# Patient Record
Sex: Female | Born: 1990 | Race: White | Hispanic: No | Marital: Single | Smoking: Current every day smoker
Health system: Southern US, Community
[De-identification: ages and names within clinical notes are randomized; demographics above are authoritative.]

## PROBLEM LIST (undated history)

## (undated) DIAGNOSIS — R002 Palpitations: Secondary | ICD-10-CM

## (undated) DIAGNOSIS — F913 Oppositional defiant disorder: Secondary | ICD-10-CM

## (undated) DIAGNOSIS — F1910 Other psychoactive substance abuse, uncomplicated: Secondary | ICD-10-CM

## (undated) DIAGNOSIS — R000 Tachycardia, unspecified: Secondary | ICD-10-CM

## (undated) DIAGNOSIS — F341 Dysthymic disorder: Secondary | ICD-10-CM

## (undated) DIAGNOSIS — F329 Major depressive disorder, single episode, unspecified: Secondary | ICD-10-CM

## (undated) DIAGNOSIS — F32A Depression, unspecified: Secondary | ICD-10-CM

## (undated) DIAGNOSIS — R42 Dizziness and giddiness: Secondary | ICD-10-CM

## (undated) DIAGNOSIS — N926 Irregular menstruation, unspecified: Secondary | ICD-10-CM

## (undated) DIAGNOSIS — R4689 Other symptoms and signs involving appearance and behavior: Secondary | ICD-10-CM

## (undated) DIAGNOSIS — Z658 Other specified problems related to psychosocial circumstances: Secondary | ICD-10-CM

## (undated) DIAGNOSIS — D649 Anemia, unspecified: Secondary | ICD-10-CM

## (undated) HISTORY — DX: Dizziness and giddiness: R42

## (undated) HISTORY — DX: Irregular menstruation, unspecified: N92.6

## (undated) HISTORY — DX: Palpitations: R00.2

## (undated) HISTORY — DX: Major depressive disorder, single episode, unspecified: F32.9

## (undated) HISTORY — DX: Other symptoms and signs involving appearance and behavior: R46.89

## (undated) HISTORY — DX: Other specified problems related to psychosocial circumstances: Z65.8

## (undated) HISTORY — DX: Other psychoactive substance abuse, uncomplicated: F19.10

## (undated) HISTORY — DX: Anemia, unspecified: D64.9

## (undated) HISTORY — DX: Depression, unspecified: F32.A

## (undated) HISTORY — DX: Tachycardia, unspecified: R00.0

## (undated) HISTORY — DX: Oppositional defiant disorder: F91.3

## (undated) HISTORY — DX: Dysthymic disorder: F34.1

---
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
## 2012-07-22 ENCOUNTER — Emergency Department (HOSPITAL_COMMUNITY)
Admission: EM | Admit: 2012-07-22 | Discharge: 2012-07-22 | Disposition: A | Discharge status: Home / Self Care | Payer: Managed Care, Other (non HMO) | Attending: Emergency Medicine | Admitting: Emergency Medicine

## 2012-07-22 ENCOUNTER — Encounter (HOSPITAL_COMMUNITY): Payer: Self-pay | Admitting: Family Medicine

## 2012-07-22 DIAGNOSIS — F17200 Nicotine dependence, unspecified, uncomplicated: Secondary | ICD-10-CM | POA: Insufficient documentation

## 2012-07-22 DIAGNOSIS — Z8659 Personal history of other mental and behavioral disorders: Secondary | ICD-10-CM | POA: Insufficient documentation

## 2012-07-22 DIAGNOSIS — Z862 Personal history of diseases of the blood and blood-forming organs and certain disorders involving the immune mechanism: Secondary | ICD-10-CM | POA: Insufficient documentation

## 2012-07-22 DIAGNOSIS — R109 Unspecified abdominal pain: Secondary | ICD-10-CM | POA: Insufficient documentation

## 2012-07-22 DIAGNOSIS — Z3202 Encounter for pregnancy test, result negative: Secondary | ICD-10-CM | POA: Insufficient documentation

## 2012-07-22 DIAGNOSIS — Z8742 Personal history of other diseases of the female genital tract: Secondary | ICD-10-CM | POA: Insufficient documentation

## 2012-07-22 DIAGNOSIS — Z638 Other specified problems related to primary support group: Secondary | ICD-10-CM | POA: Insufficient documentation

## 2012-07-22 DIAGNOSIS — N898 Other specified noninflammatory disorders of vagina: Secondary | ICD-10-CM | POA: Insufficient documentation

## 2012-07-22 DIAGNOSIS — N939 Abnormal uterine and vaginal bleeding, unspecified: Secondary | ICD-10-CM

## 2012-07-22 DIAGNOSIS — Z658 Other specified problems related to psychosocial circumstances: Secondary | ICD-10-CM | POA: Insufficient documentation

## 2012-07-22 DIAGNOSIS — F1910 Other psychoactive substance abuse, uncomplicated: Secondary | ICD-10-CM | POA: Insufficient documentation

## 2012-07-22 DIAGNOSIS — Z8679 Personal history of other diseases of the circulatory system: Secondary | ICD-10-CM | POA: Insufficient documentation

## 2012-07-22 LAB — WET PREP, GENITAL
WBC, Wet Prep HPF POC: NONE SEEN
Yeast Wet Prep HPF POC: NONE SEEN

## 2012-07-22 LAB — PREGNANCY, URINE: Preg Test, Ur: NEGATIVE

## 2012-07-22 NOTE — ED Provider Notes (Signed)
History     CSN: [id number]  Arrival date & time 07/22/12  0339   First MD Initiated Contact with Patient 07/22/12 0411      Chief Complaint  Patient presents with  . Vaginal Bleeding    (Consider location/radiation/quality/duration/timing/severity/associated sxs/prior treatment) HPI 22 year old female, G2 P0 who puts her last menstrual period at December 28 presents emergency department complaining of vaginal bleeding and concern for miscarriage. Patient reports she was told on Monday that she was pregnant after blood tests done at her doctor's office.  Tonight while having intercourse, her boyfriend told her that she was having bleeding. She reports sharp cramping sensation in her lower abdomen. She denies passage of any tissue or clots. She does not feel she is bleeding any further. Patient reports she had a miscarriage last year. Patient reports she was planning to go to [facility] tomorrow for termination of the pregnancy. She does not know her blood type. She denies any fever chills, no urinary symptoms, no vaginal discharge prior to the bleeding. Past Medical History  Diagnosis Date  . Dysthymic disorder   . Oppositional defiant behavior   . Polysubstance abuse   . Interpersonal problem   . Anemia   . Depression   . Palpitations   . Tachycardia     exaggerated tacycardia response upon standing  . Dizziness   . Irregular menstrual cycle     Past Surgical History  Procedure Date  . Tonsillectomy 2008  . Multiple tooth extractions 2009    Family History  Problem Relation Age of Onset  . Cancer Paternal Grandmother     History  Substance Use Topics  . Smoking status: Current Every Day Smoker -- 1.0 packs/day    Types: Cigarettes  . Smokeless tobacco: Not on file  . Alcohol Use: Yes     Comment: Occasionally    OB History    Grav Para Term Preterm Abortions TAB SAB Ect Mult Living                  Review of Systems  All other systems reviewed and  are negative.    Allergies  Review of patient's allergies indicates no known allergies.  Home Medications  No current outpatient prescriptions on file.  BP 112/68  Pulse 96  Temp 98.5 F (36.9 C) (Oral)  Resp 19  Ht 5\' 6"  (1.676 m)  Wt 122 lb 6.4 oz (55.52 kg)  BMI 19.76 kg/m2  SpO2 100%  LMP 06/13/2012  Physical Exam  Nursing note and vitals reviewed. Constitutional: She is oriented to person, place, and time. She appears well-developed and well-nourished. She appears distressed (patient is tearful, curled up on the bed).  HENT:  Head: Normocephalic and atraumatic.  Nose: Nose normal.  Mouth/Throat: Oropharynx is clear and moist. No oropharyngeal exudate.  Neck: Normal range of motion. No JVD present. No tracheal deviation present. No thyromegaly present.  Cardiovascular: Normal rate, regular rhythm, normal heart sounds and intact distal pulses.  Exam reveals no gallop and no friction rub.   No murmur heard. Pulmonary/Chest: Effort normal and breath sounds normal. No stridor. No respiratory distress. She has no wheezes. She has no rales. She exhibits no tenderness.  Abdominal: Soft. Bowel sounds are normal. She exhibits no distension and no mass. There is no guarding.  Genitourinary:       External genitalia normal Vagina with dark blood, no clots or tissue Cervix closed no lesions No cervical motion tenderness Adnexa palpated, no masses or tenderness noted  Bladder palpated no tenderness Uterus palpated no masses  But mild tenderness    Musculoskeletal: Normal range of motion. She exhibits no edema and no tenderness.  Lymphadenopathy:    She has no cervical adenopathy.  Neurological: She is alert and oriented to person, place, and time. She exhibits normal muscle tone. Coordination normal.  Skin: Skin is warm and dry. No rash noted. She is not diaphoretic. No erythema. No pallor.  Psychiatric:       Patient has flat affect, tearful    ED Course  Procedures  (including critical care time)   Labs Reviewed  PREGNANCY, URINE  WET PREP, GENITAL  GC/CHLAMYDIA PROBE AMP   No results found.   1. Vaginal bleeding       MDM  22 year old female with vaginal bleeding, reported positive pregnancy test. Patient was planning on termination of pregnancy tomorrow. We'll check pelvic exam for possible cervical pathology. Also get GC chlamydia and wet prep done  6:41 AM Patient with negative pregnancy test at this time. It is unclear if she is having a normal menstrual period, or if she has had a completed miscarriage. As she only started bleeding a little while ago, I would expect her to still have a positive pregnancy test. I will refer her back to her primary care doctor who diagnosed her with the pregnancy, and[facility]or [facility].        Olivia Mackie, MD 07/22/12 [phone]

## 2012-07-22 NOTE — ED Notes (Addendum)
Patient states "I'm fairly certain I just had a miscarriage." Patient states she started having cramping in her lower abdomen and vaginal bleeding at 0300. Patient states she was having intercourse when the symptoms started. Patient unsure if she has passed clots or any products of conception. Reports that she is approximately 1 month pregnant.

## 2014-02-01 ENCOUNTER — Emergency Department (HOSPITAL_COMMUNITY)
Admission: EM | Admit: 2014-02-01 | Discharge: 2014-02-01 | Disposition: A | Discharge status: Home / Self Care | Payer: Managed Care, Other (non HMO) | Attending: Emergency Medicine | Admitting: Emergency Medicine

## 2014-02-01 ENCOUNTER — Encounter (HOSPITAL_COMMUNITY): Payer: Self-pay | Admitting: Emergency Medicine

## 2014-02-01 DIAGNOSIS — N390 Urinary tract infection, site not specified: Secondary | ICD-10-CM | POA: Diagnosis not present

## 2014-02-01 DIAGNOSIS — Y9289 Other specified places as the place of occurrence of the external cause: Secondary | ICD-10-CM | POA: Insufficient documentation

## 2014-02-01 DIAGNOSIS — F3289 Other specified depressive episodes: Secondary | ICD-10-CM | POA: Diagnosis not present

## 2014-02-01 DIAGNOSIS — Z79899 Other long term (current) drug therapy: Secondary | ICD-10-CM | POA: Insufficient documentation

## 2014-02-01 DIAGNOSIS — Z862 Personal history of diseases of the blood and blood-forming organs and certain disorders involving the immune mechanism: Secondary | ICD-10-CM | POA: Diagnosis not present

## 2014-02-01 DIAGNOSIS — Y9389 Activity, other specified: Secondary | ICD-10-CM | POA: Insufficient documentation

## 2014-02-01 DIAGNOSIS — X500XXA Overexertion from strenuous movement or load, initial encounter: Secondary | ICD-10-CM | POA: Diagnosis not present

## 2014-02-01 DIAGNOSIS — R000 Tachycardia, unspecified: Secondary | ICD-10-CM | POA: Diagnosis not present

## 2014-02-01 DIAGNOSIS — F17200 Nicotine dependence, unspecified, uncomplicated: Secondary | ICD-10-CM | POA: Insufficient documentation

## 2014-02-01 DIAGNOSIS — IMO0002 Reserved for concepts with insufficient information to code with codable children: Secondary | ICD-10-CM | POA: Insufficient documentation

## 2014-02-01 DIAGNOSIS — Z3202 Encounter for pregnancy test, result negative: Secondary | ICD-10-CM | POA: Diagnosis not present

## 2014-02-01 DIAGNOSIS — F329 Major depressive disorder, single episode, unspecified: Secondary | ICD-10-CM | POA: Diagnosis not present

## 2014-02-01 DIAGNOSIS — S39012A Strain of muscle, fascia and tendon of lower back, initial encounter: Secondary | ICD-10-CM

## 2014-02-01 LAB — URINE MICROSCOPIC-ADD ON

## 2014-02-01 LAB — WET PREP, GENITAL
Trich, Wet Prep: NONE SEEN
YEAST WET PREP: NONE SEEN

## 2014-02-01 LAB — POC URINE PREG, ED: Preg Test, Ur: NEGATIVE

## 2014-02-01 LAB — URINALYSIS, ROUTINE W REFLEX MICROSCOPIC
BILIRUBIN URINE: NEGATIVE
Glucose, UA: NEGATIVE mg/dL
KETONES UR: NEGATIVE mg/dL
NITRITE: POSITIVE — AB
PH: 6 (ref 5.0–8.0)
PROTEIN: NEGATIVE mg/dL
Specific Gravity, Urine: 1.017 (ref 1.005–1.030)
UROBILINOGEN UA: 1 mg/dL (ref 0.0–1.0)

## 2014-02-01 MED ORDER — CEPHALEXIN 500 MG PO CAPS: 500.0000 mg | ORAL_CAPSULE | Freq: Once | ORAL | Status: DC

## 2014-02-01 MED ORDER — IBUPROFEN 400 MG PO TABS: 400.0000 mg | ORAL_TABLET | Freq: Four times a day (QID) | ORAL | Status: DC | PRN

## 2014-02-01 MED ORDER — NITROFURANTOIN MONOHYD MACRO 100 MG PO CAPS: 100.0000 mg | ORAL_CAPSULE | Freq: Two times a day (BID) | ORAL | Status: AC

## 2014-02-01 MED ORDER — IBUPROFEN 200 MG PO TABS
600.0000 mg | ORAL_TABLET | Freq: Once | ORAL | Status: AC
  Administered 2014-02-01: 600 mg via ORAL
  Filled 2014-02-01: qty 3

## 2014-02-01 MED ORDER — METHOCARBAMOL 500 MG PO TABS: 500.0000 mg | ORAL_TABLET | Freq: Two times a day (BID) | ORAL | Status: AC

## 2014-02-01 MED ORDER — PHENAZOPYRIDINE HCL 200 MG PO TABS: 200.0000 mg | ORAL_TABLET | Freq: Three times a day (TID) | ORAL | Status: AC

## 2014-02-01 MED ORDER — TRAMADOL HCL 50 MG PO TABS: 50.0000 mg | ORAL_TABLET | Freq: Four times a day (QID) | ORAL | Status: AC | PRN

## 2014-02-01 NOTE — ED Notes (Signed)
Patient is alert and oriented x3.  She was given DC instructions and follow up visit instructions.  Patient gave verbal understanding. She was DC ambulatory under her own power to home.  V/S stable.  He was not showing any signs of distress on DC 

## 2014-02-01 NOTE — ED Notes (Signed)
Pt reports twisting her lower back Friday while at work.

## 2014-02-01 NOTE — Discharge Instructions (Signed)
You have a urinary tract infection. Please take the antibiotics as prescribed, and finish the entire course. Please take the medication for the back pain. See the [facility] wellness doctor or a primary care doctor of your choice for further care.   Back Exercises Back exercises help treat and prevent back injuries. The goal of back exercises is to increase the strength of your abdominal and back muscles and the flexibility of your back. These exercises should be started when you no longer have back pain. Back exercises include:  Pelvic Tilt. Lie on your back with your knees bent. Tilt your pelvis until the lower part of your back is against the floor. Hold this position 5 to 10 sec and repeat 5 to 10 times.  Knee to Chest. Pull first 1 knee up against your chest and hold for 20 to 30 seconds, repeat this with the other knee, and then both knees. This may be done with the other leg straight or bent, whichever feels better.  Sit-Ups or Curl-Ups. Bend your knees 90 degrees. Start with tilting your pelvis, and do a partial, slow sit-up, lifting your trunk only 30 to 45 degrees off the floor. Take at least 2 to 3 seconds for each sit-up. Do not do sit-ups with your knees out straight. If partial sit-ups are difficult, simply do the above but with only tightening your abdominal muscles and holding it as directed.  Hip-Lift. Lie on your back with your knees flexed 90 degrees. Push down with your feet and shoulders as you raise your hips a couple inches off the floor; hold for 10 seconds, repeat 5 to 10 times.  Back arches. Lie on your stomach, propping yourself up on bent elbows. Slowly press on your hands, causing an arch in your low back. Repeat 3 to 5 times. Any initial stiffness and discomfort should lessen with repetition over time.  Shoulder-Lifts. Lie face down with arms beside your body. Keep hips and torso pressed to floor as you slowly lift your head and shoulders off the floor. Do not overdo your  exercises, especially in the beginning. Exercises may cause you some mild back discomfort which lasts for a few minutes; however, if the pain is more severe, or lasts for more than 15 minutes, do not continue exercises until you see your caregiver. Improvement with exercise therapy for back problems is slow.  See your caregivers for assistance with developing a proper back exercise program. Document Released: 07/11/2004 Document Revised: 08/26/2011 Document Reviewed: 04/04/2011 [facility] Patient Information 2015 [place], [place]. This information is not intended to replace advice given to you by your health care provider. Make sure you discuss any questions you have with your health care provider. Urinary Tract Infection Urinary tract infections (UTIs) can develop anywhere along your urinary tract. Your urinary tract is your body's drainage system for removing wastes and extra water. Your urinary tract includes two kidneys, two ureters, a bladder, and a urethra. Your kidneys are a pair of bean-shaped organs. Each kidney is about the size of your fist. They are located below your ribs, one on each side of your spine. CAUSES Infections are caused by microbes, which are microscopic organisms, including fungi, viruses, and bacteria. These organisms are so small that they can only be seen through a microscope. Bacteria are the microbes that most commonly cause UTIs. SYMPTOMS  Symptoms of UTIs may vary by age and gender of the patient and by the location of the infection. Symptoms in young women typically include a  frequent and intense urge to urinate and a painful, burning feeling in the bladder or urethra during urination. Older women and men are more likely to be tired, shaky, and weak and have muscle aches and abdominal pain. A fever may mean the infection is in your kidneys. Other symptoms of a kidney infection include pain in your back or sides below the ribs, nausea, and vomiting. DIAGNOSIS To diagnose a  UTI, your caregiver will ask you about your symptoms. Your caregiver also will ask to provide a urine sample. The urine sample will be tested for bacteria and white blood cells. White blood cells are made by your body to help fight infection. TREATMENT  Typically, UTIs can be treated with medication. Because most UTIs are caused by a bacterial infection, they usually can be treated with the use of antibiotics. The choice of antibiotic and length of treatment depend on your symptoms and the type of bacteria causing your infection. HOME CARE INSTRUCTIONS  If you were prescribed antibiotics, take them exactly as your caregiver instructs you. Finish the medication even if you feel better after you have only taken some of the medication.  Drink enough water and fluids to keep your urine clear or pale yellow.  Avoid caffeine, tea, and carbonated beverages. They tend to irritate your bladder.  Empty your bladder often. Avoid holding urine for long periods of time.  Empty your bladder before and after sexual intercourse.  After a bowel movement, women should cleanse from front to back. Use each tissue only once. SEEK MEDICAL CARE IF:   You have back pain.  You develop a fever.  Your symptoms do not begin to resolve within 3 days. SEEK IMMEDIATE MEDICAL CARE IF:   You have severe back pain or lower abdominal pain.  You develop chills.  You have nausea or vomiting.  You have continued burning or discomfort with urination. MAKE SURE YOU:   Understand these instructions.  Will watch your condition.  Will get help right away if you are not doing well or get worse. Document Released: 03/13/2005 Document Revised: 12/03/2011 Document Reviewed: 07/12/2011 [facility] Patient Information 2015 [place], [place]. This information is not intended to replace advice given to you by your health care provider. Make sure you discuss any questions you have with your health care provider.

## 2014-02-01 NOTE — ED Notes (Signed)
Patient is alert and oriented x3.  Today she states that she passed out.  Patient  Denies any Hx of passing out.  She adds that she had an IUD placed in April and  States that she has had constant bleeding since the IUD being placed.  Currently  She rates her pain 6 of 10 cramping pain.

## 2014-02-02 LAB — GC/CHLAMYDIA PROBE AMP
CT PROBE, AMP APTIMA: NEGATIVE
GC PROBE AMP APTIMA: NEGATIVE

## 2014-02-10 NOTE — ED Provider Notes (Signed)
CSN: [id number]     Arrival date & time 02/01/14  0051 History   First MD Initiated Contact with Patient 02/01/14 0119     Chief Complaint  Patient presents with  . Back Pain     (Consider location/radiation/quality/duration/timing/severity/associated sxs/prior Treatment) HPI Comments: Pt comes in with cc of back pain. Pt reports twisting her back whilst at work. Started having right sided back pain immediately, worse with bending and twisting. On exam - pt also had lower quadrant abd pain. She has mild dysuria. + vaginal discharge.  Patient is a 23 y.o. female presenting with back pain. The history is provided by the patient.  Back Pain Associated symptoms: dysuria   Associated symptoms: no abdominal pain, no chest pain and no headaches     Past Medical History  Diagnosis Date  . Dysthymic disorder   . Oppositional defiant behavior   . Polysubstance abuse   . Interpersonal problem   . Anemia   . Depression   . Palpitations   . Tachycardia     exaggerated tacycardia response upon standing  . Dizziness   . Irregular menstrual cycle    Past Surgical History  Procedure Laterality Date  . Tonsillectomy  2008  . Multiple tooth extractions  2009   Family History  Problem Relation Age of Onset  . Cancer Paternal Grandmother    History  Substance Use Topics  . Smoking status: Current Every Day Smoker -- 1.00 packs/day    Types: Cigarettes  . Smokeless tobacco: Not on file  . Alcohol Use: Yes     Comment: Occasionally   OB History   Grav Para Term Preterm Abortions TAB SAB Ect Mult Living                 Review of Systems  Constitutional: Positive for activity change.  Respiratory: Negative for shortness of breath.   Cardiovascular: Negative for chest pain.  Gastrointestinal: Negative for nausea, vomiting and abdominal pain.  Genitourinary: Positive for dysuria and vaginal discharge.  Musculoskeletal: Positive for back pain. Negative for neck pain.  Neurological:  Negative for headaches.      Allergies  Review of patient's allergies indicates no known allergies.  Home Medications   Prior to Admission medications   Medication Sig Start Date End Date Taking? Authorizing Provider  ibuprofen (ADVIL,MOTRIN) 400 MG tablet Take 1 tablet (400 mg total) by mouth every 6 (six) hours as needed. 02/01/14   Derwood Kaplan, MD  methocarbamol (ROBAXIN) 500 MG tablet Take 1 tablet (500 mg total) by mouth 2 (two) times daily. 02/01/14   Derwood Kaplan, MD  nitrofurantoin, macrocrystal-monohydrate, (MACROBID) 100 MG capsule Take 1 capsule (100 mg total) by mouth 2 (two) times daily. 02/01/14   Derwood Kaplan, MD  phenazopyridine (PYRIDIUM) 200 MG tablet Take 1 tablet (200 mg total) by mouth 3 (three) times daily. 02/01/14   Derwood Kaplan, MD  traMADol (ULTRAM) 50 MG tablet Take 1 tablet (50 mg total) by mouth every 6 (six) hours as needed. 02/01/14   Solimar Maiden Rhunette Croft, MD   BP 115/64  Pulse 74  Temp(Src) 98.2 F (36.8 C) (Oral)  Resp 14  SpO2 99%  LMP 01/10/2014 Physical Exam  Nursing note and vitals reviewed. Constitutional: She is oriented to person, place, and time. She appears well-developed.  HENT:  Head: Normocephalic and atraumatic.  Eyes: Conjunctivae and EOM are normal. Pupils are equal, round, and reactive to light.  Neck: Normal range of motion. Neck supple.  Cardiovascular: Normal rate, regular rhythm,  normal heart sounds and intact distal pulses.   No murmur heard. Pulmonary/Chest: Effort normal and breath sounds normal. No respiratory distress. She has no wheezes.  Abdominal: Soft. Bowel sounds are normal. She exhibits no distension. There is no tenderness. There is no rebound and no guarding.  Genitourinary: Vagina normal and uterus normal.  External exam - normal, no lesions Speculum exam: Pt has some white discharge, no blood Bimanual exam: Patient has no CMT, no adnexal tenderness or fullness and cervical os is closed  Musculoskeletal:  Pt  has tenderness over the lumbar region No step offs, no erythema. Pt has 2+ patellar reflex bilaterally. Able to discriminate between sharp and dull. Able to ambulate   Neurological: She is alert and oriented to person, place, and time.  Skin: Skin is warm and dry.    ED Course  Procedures (including critical care time) Labs Review Labs Reviewed  WET PREP, GENITAL - Abnormal; Notable for the following:    Clue Cells Wet Prep HPF POC FEW (*)    WBC, Wet Prep HPF POC FEW (*)    All other components within normal limits  URINALYSIS, ROUTINE W REFLEX MICROSCOPIC - Abnormal; Notable for the following:    APPearance CLOUDY (*)    Hgb urine dipstick MODERATE (*)    Nitrite POSITIVE (*)    Leukocytes, UA SMALL (*)    All other components within normal limits  URINE MICROSCOPIC-ADD ON - Abnormal; Notable for the following:    Squamous Epithelial / LPF MANY (*)    Bacteria, UA MANY (*)    All other components within normal limits  GC/CHLAMYDIA PROBE AMP  POC URINE PREG, ED    Imaging Review No results found.   EKG Interpretation None      MDM   Final diagnoses:  UTI (lower urinary tract infection)  Back strain, initial encounter    Pt with lower back pain - which is reproducible with palpation and has an inciting event. No imaging needed. No ivda, no neuro deficits. No imaging indicated.  Pt has a normal GU exam. + uti per labs, will tx for UTI. Weight restrictions.  Derwood Kaplan, MD 02/10/14 1324

## 2014-08-21 ENCOUNTER — Encounter (HOSPITAL_COMMUNITY): Payer: Self-pay | Admitting: *Deleted

## 2014-08-21 ENCOUNTER — Emergency Department (HOSPITAL_COMMUNITY): Payer: Managed Care, Other (non HMO)

## 2014-08-21 ENCOUNTER — Emergency Department (HOSPITAL_COMMUNITY)
Admission: EM | Admit: 2014-08-21 | Discharge: 2014-08-22 | Disposition: A | Discharge status: Home / Self Care | Payer: Managed Care, Other (non HMO) | Attending: Emergency Medicine | Admitting: Emergency Medicine

## 2014-08-21 DIAGNOSIS — Z720 Tobacco use: Secondary | ICD-10-CM | POA: Insufficient documentation

## 2014-08-21 DIAGNOSIS — F329 Major depressive disorder, single episode, unspecified: Secondary | ICD-10-CM | POA: Insufficient documentation

## 2014-08-21 DIAGNOSIS — R079 Chest pain, unspecified: Secondary | ICD-10-CM | POA: Diagnosis present

## 2014-08-21 DIAGNOSIS — Z79899 Other long term (current) drug therapy: Secondary | ICD-10-CM | POA: Insufficient documentation

## 2014-08-21 DIAGNOSIS — Z792 Long term (current) use of antibiotics: Secondary | ICD-10-CM | POA: Insufficient documentation

## 2014-08-21 DIAGNOSIS — Z8742 Personal history of other diseases of the female genital tract: Secondary | ICD-10-CM | POA: Diagnosis not present

## 2014-08-21 DIAGNOSIS — R0789 Other chest pain: Secondary | ICD-10-CM

## 2014-08-21 DIAGNOSIS — Z862 Personal history of diseases of the blood and blood-forming organs and certain disorders involving the immune mechanism: Secondary | ICD-10-CM | POA: Insufficient documentation

## 2014-08-21 NOTE — ED Notes (Signed)
The pt is c/o mid-chest pain  Since 2030 with some weakness and she felt like her heart was racing.  Hs of the same.  lmp  Feb 28th

## 2014-08-22 MED ORDER — RANITIDINE HCL 150 MG PO CAPS: 150.0000 mg | ORAL_CAPSULE | Freq: Every day | ORAL | Status: AC

## 2014-08-22 NOTE — Discharge Instructions (Signed)
Esophagitis Esophagitis is inflammation of the esophagus. It can involve swelling, soreness, and pain in the esophagus. This condition can make it difficult and painful to swallow. CAUSES  Most causes of esophagitis are not serious. Many different factors can cause esophagitis, including:  Gastroesophageal reflux disease (GERD). This is when acid from your stomach flows up into the esophagus.  Recurrent vomiting.  An allergic-type reaction.  Certain medicines, especially those that come in large pills.  Ingestion of harmful chemicals, such as household cleaning products.  Heavy alcohol use.  An infection of the esophagus.  Radiation treatment for cancer.  Certain diseases such as sarcoidosis, Crohn's disease, and scleroderma. These diseases may cause recurrent esophagitis. SYMPTOMS   Trouble swallowing.  Painful swallowing.  Chest pain.  Difficulty breathing.  Nausea.  Vomiting.  Abdominal pain. DIAGNOSIS  Your caregiver will take your history and do a physical exam. Depending upon what your caregiver finds, certain tests may also be done, including:  Barium X-ray. You will drink a solution that coats the esophagus, and X-rays will be taken.  Endoscopy. A lighted tube is put down the esophagus so your caregiver can examine the area.  Allergy tests. These can sometimes be arranged through follow-up visits. TREATMENT  Treatment will depend on the cause of your esophagitis. In some cases, steroids or other medicines may be given to help relieve your symptoms or to treat the underlying cause of your condition. Medicines that may be recommended include:  Viscous lidocaine, to soothe the esophagus.  Antacids.  Acid reducers.  Proton pump inhibitors.  Antiviral medicines for certain viral infections of the esophagus.  Antifungal medicines for certain fungal infections of the esophagus.  Antibiotic medicines, depending on the cause of the esophagitis. HOME CARE  INSTRUCTIONS   Avoid foods and drinks that seem to make your symptoms worse.  Eat small, frequent meals instead of large meals.  Avoid eating for the 3 hours prior to your bedtime.  If you have trouble taking pills, use a pill splitter to decrease the size and likelihood of the pill getting stuck or injuring the esophagus on the way down. Drinking water after taking a pill also helps.  Stop smoking if you smoke.  Maintain a healthy weight.  Wear loose-fitting clothing. Do not wear anything tight around your waist that causes pressure on your stomach.  Raise the head of your bed 6 to 8 inches with wood blocks to help you sleep. Extra pillows will not help.  Only take over-the-counter or prescription medicines as directed by your caregiver. SEEK IMMEDIATE MEDICAL CARE IF:  You have severe chest pain that radiates into your arm, neck, or jaw.  You feel sweaty, dizzy, or lightheaded.  You have shortness of breath.  You vomit blood.  You have difficulty or pain with swallowing.  You have bloody or black, tarry stools.  You have a fever.  You have a burning sensation in the chest more than 3 times a week for more than 2 weeks.  You cannot swallow, drink, or eat.  You drool because you cannot swallow your saliva. MAKE SURE YOU:  Understand these instructions.  Will watch your condition.  Will get help right away if you are not doing well or get worse. Document Released: 07/11/2004 Document Revised: 08/26/2011 Document Reviewed: 02/01/2011 [employer] Patient Information 2015 [employer]. This information is not intended to replace advice given to you by your health care provider. Make sure you discuss any questions you have with your health care provider.  

## 2014-08-22 NOTE — ED Notes (Signed)
MD at bedside. 

## 2014-08-22 NOTE — ED Provider Notes (Signed)
CSN: [id number]     Arrival date & time 08/21/14  2202 History   First MD Initiated Contact with Patient 08/21/14 2340     Chief Complaint  Patient presents with  . Chest Pain     Patient is a 24 y.o. female presenting with chest pain. The history is provided by the patient. No language interpreter was used.  Chest Pain  [name] presents for evaluation of chest pain. She's had left-sided chest pain that started about 4 hours ago. It is described as sharp and stabbing in nature and lasts for a second time. Episodes are intermittent and come every 5-10 minutes. She briefly had some shortness of breath and weakness, but this has since resolved. She currently has no chest pain. She hassyncopal or near syncopal feelings. She denies any fevers, coughing, abdominal pain, vomiting, leg swelling or pain. She has no history of DVT. She does not take any oral hormones. She denies any chance of pregnancy. Symptoms are moderate and intermittent and improving.  Past Medical History  Diagnosis Date  . Dysthymic disorder   . Oppositional defiant behavior   . Polysubstance abuse   . Interpersonal problem   . Anemia   . Depression   . Palpitations   . Tachycardia     exaggerated tacycardia response upon standing  . Dizziness   . Irregular menstrual cycle    Past Surgical History  Procedure Laterality Date  . Tonsillectomy  2008  . Multiple tooth extractions  2009   Family History  Problem Relation Age of Onset  . Cancer Paternal Grandmother    History  Substance Use Topics  . Smoking status: Current Every Day Smoker -- 1.00 packs/day    Types: Cigarettes  . Smokeless tobacco: Not on file  . Alcohol Use: Yes     Comment: Occasionally   OB History    No data available     Review of Systems  Cardiovascular: Positive for chest pain.  All other systems reviewed and are negative.     Allergies  Review of patient's allergies indicates no known allergies.  Home Medications    Prior to Admission medications   Medication Sig Start Date End Date Taking? Authorizing Provider  ibuprofen (ADVIL,MOTRIN) 400 MG tablet Take 1 tablet (400 mg total) by mouth every 6 (six) hours as needed. 02/01/14   Derwood KaplanAnkit Nanavati, MD  methocarbamol (ROBAXIN) 500 MG tablet Take 1 tablet (500 mg total) by mouth 2 (two) times daily. 02/01/14   Derwood KaplanAnkit Nanavati, MD  nitrofurantoin, macrocrystal-monohydrate, (MACROBID) 100 MG capsule Take 1 capsule (100 mg total) by mouth 2 (two) times daily. 02/01/14   Derwood KaplanAnkit Nanavati, MD  phenazopyridine (PYRIDIUM) 200 MG tablet Take 1 tablet (200 mg total) by mouth 3 (three) times daily. 02/01/14   Derwood KaplanAnkit Nanavati, MD  traMADol (ULTRAM) 50 MG tablet Take 1 tablet (50 mg total) by mouth every 6 (six) hours as needed. 02/01/14   Ankit Rhunette CroftNanavati, MD   BP 113/69 mmHg  Pulse 93  Temp(Src) 98.5 F (36.9 C) (Oral)  Resp 20  SpO2 97%  LMP 08/14/2014 Physical Exam  Constitutional: She is oriented to person, place, and time. She appears well-developed and well-nourished.  HENT:  Head: Normocephalic and atraumatic.  Cardiovascular: Normal rate and regular rhythm.   No murmur heard. Pulmonary/Chest: Effort normal and breath sounds normal. No respiratory distress. She exhibits no tenderness.  Abdominal: Soft. There is no tenderness. There is no rebound and no guarding.  Musculoskeletal: She exhibits no edema or  tenderness.  Neurological: She is alert and oriented to person, place, and time.  Skin: Skin is warm and dry.  Psychiatric: She has a normal mood and affect. Her behavior is normal.  Nursing note and vitals reviewed.   ED Course  Procedures (including critical care time) Labs Review Labs Reviewed - No data to display  Imaging Review Dg Chest 2 View  08/21/2014   CLINICAL DATA:  Left-sided chest pain and shortness of breath.  EXAM: CHEST  2 VIEW  COMPARISON:  None.  FINDINGS: The cardiomediastinal contours are normal. The lungs are clear. Pulmonary vasculature  is normal. No consolidation, pleural effusion, or pneumothorax. No acute osseous abnormalities are seen.  IMPRESSION: No acute pulmonary process.   Electronically Signed   By: Rubye Oaks M.D.   On: 08/21/2014 22:37     EKG Interpretation   Date/Time:  Sunday August 21 2014 22:06:18 EST Ventricular Rate:  101 PR Interval:  126 QRS Duration: 82 QT Interval:  332 QTC Calculation: 430 R Axis:   80 Text Interpretation:  Sinus tachycardia Otherwise normal ECG Confirmed by  Lincoln Brigham [phone]) on 08/21/2014 11:42:30 PM      MDM   Final diagnoses:  Chest pain, atypical    Patient is here for evaluation of chest pain. Clinical picture is not consistent with ACS, PE, dissection, pneumonia, gastritis, cholecystitis. Suspect the patient has some element of reflux esophagitis. Discussed with patient and her treatment for esophagitis. Recommend alcohol and smoking cessation and PCP follow-up. Patient does have a history of anemia. Discussed with patient options for lab draw to evaluate for anemia and patient declines at this time.    Tilden Fossa, MD 08/22/14 [phone]

## 2015-11-23 IMAGING — DX DG CHEST 2V
2 series · 2 of 2 positions shown · non-contrast
Comparison: None.

CLINICAL DATA: Left-sided chest pain and shortness of breath.

EXAM:
CHEST  2 VIEW

[chest pa]
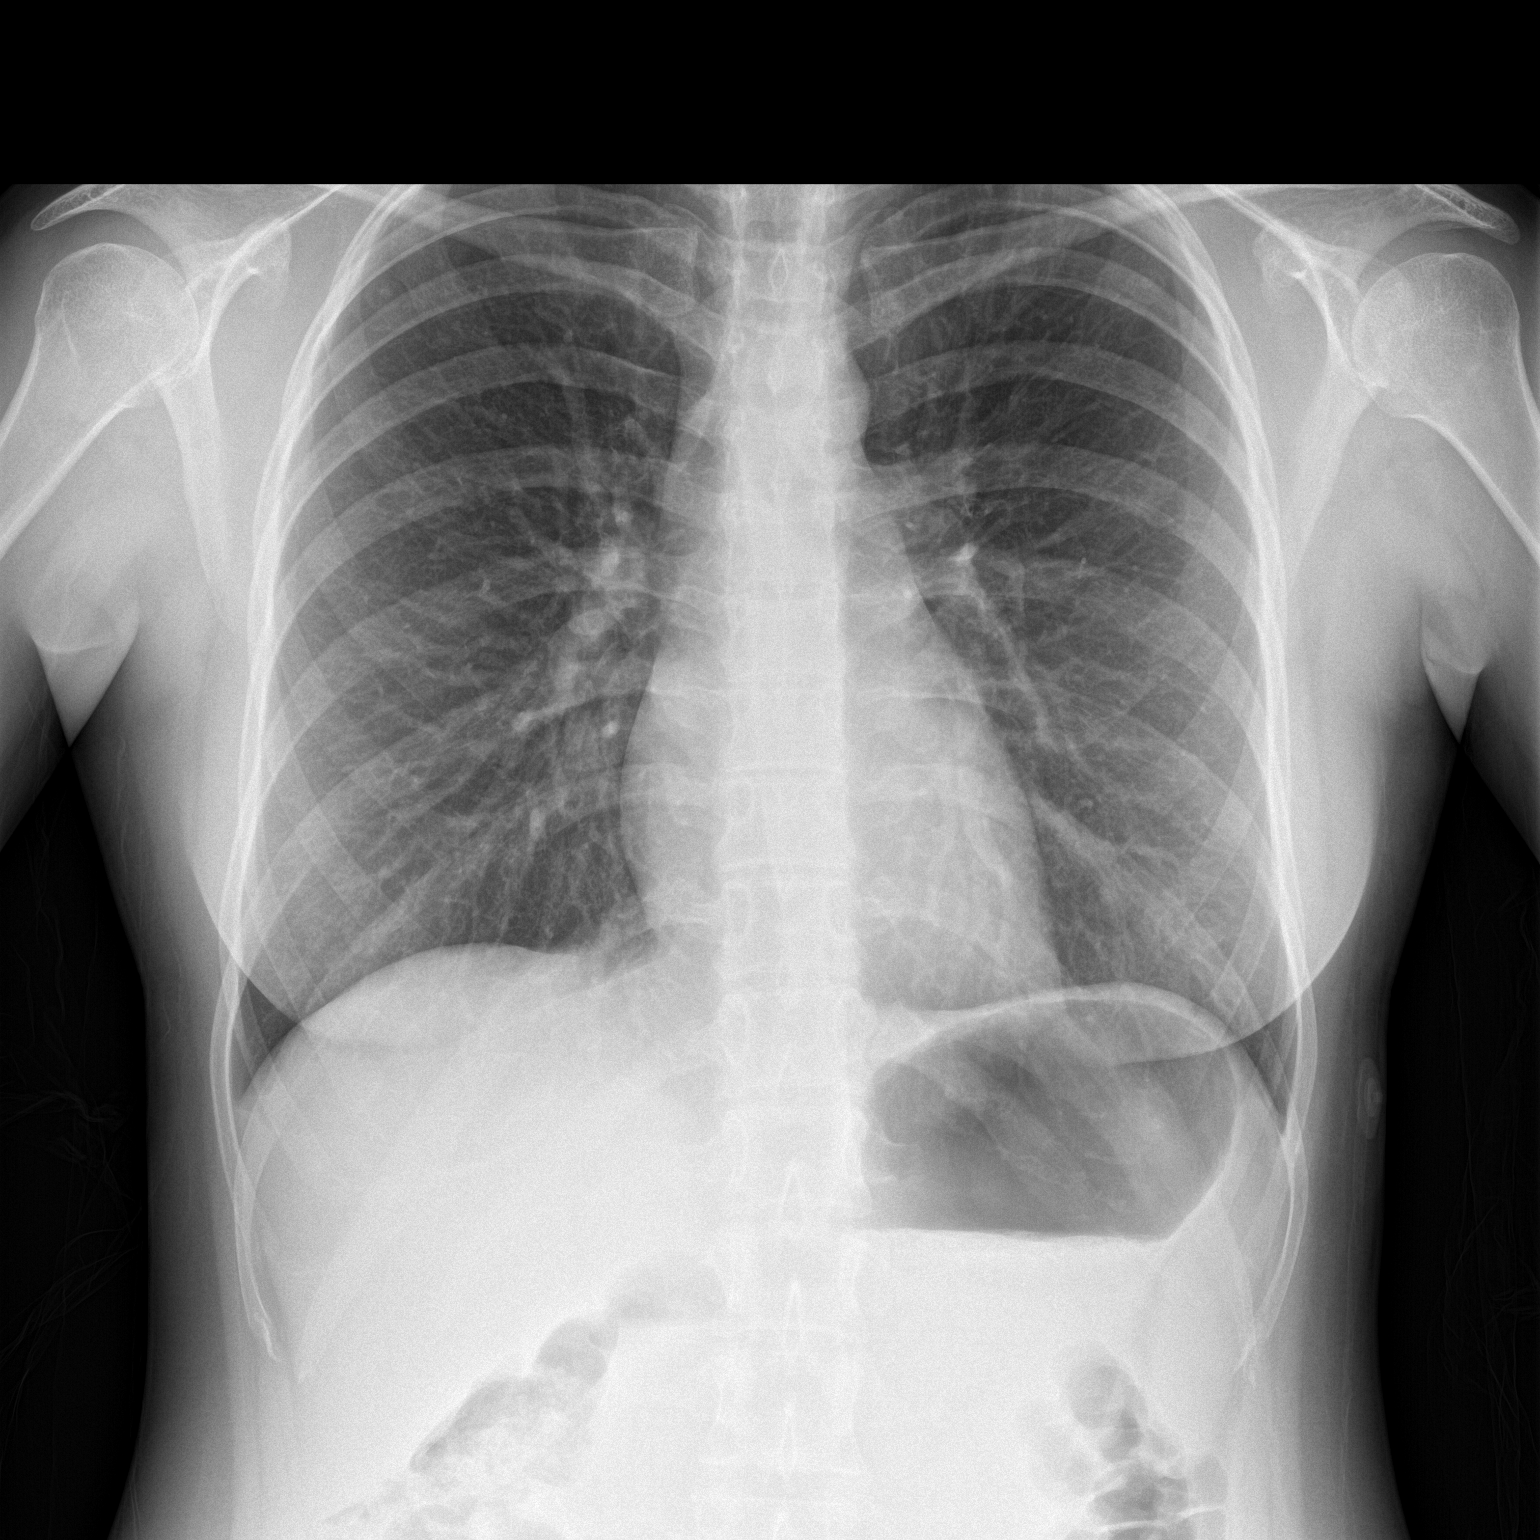

[chest lat]
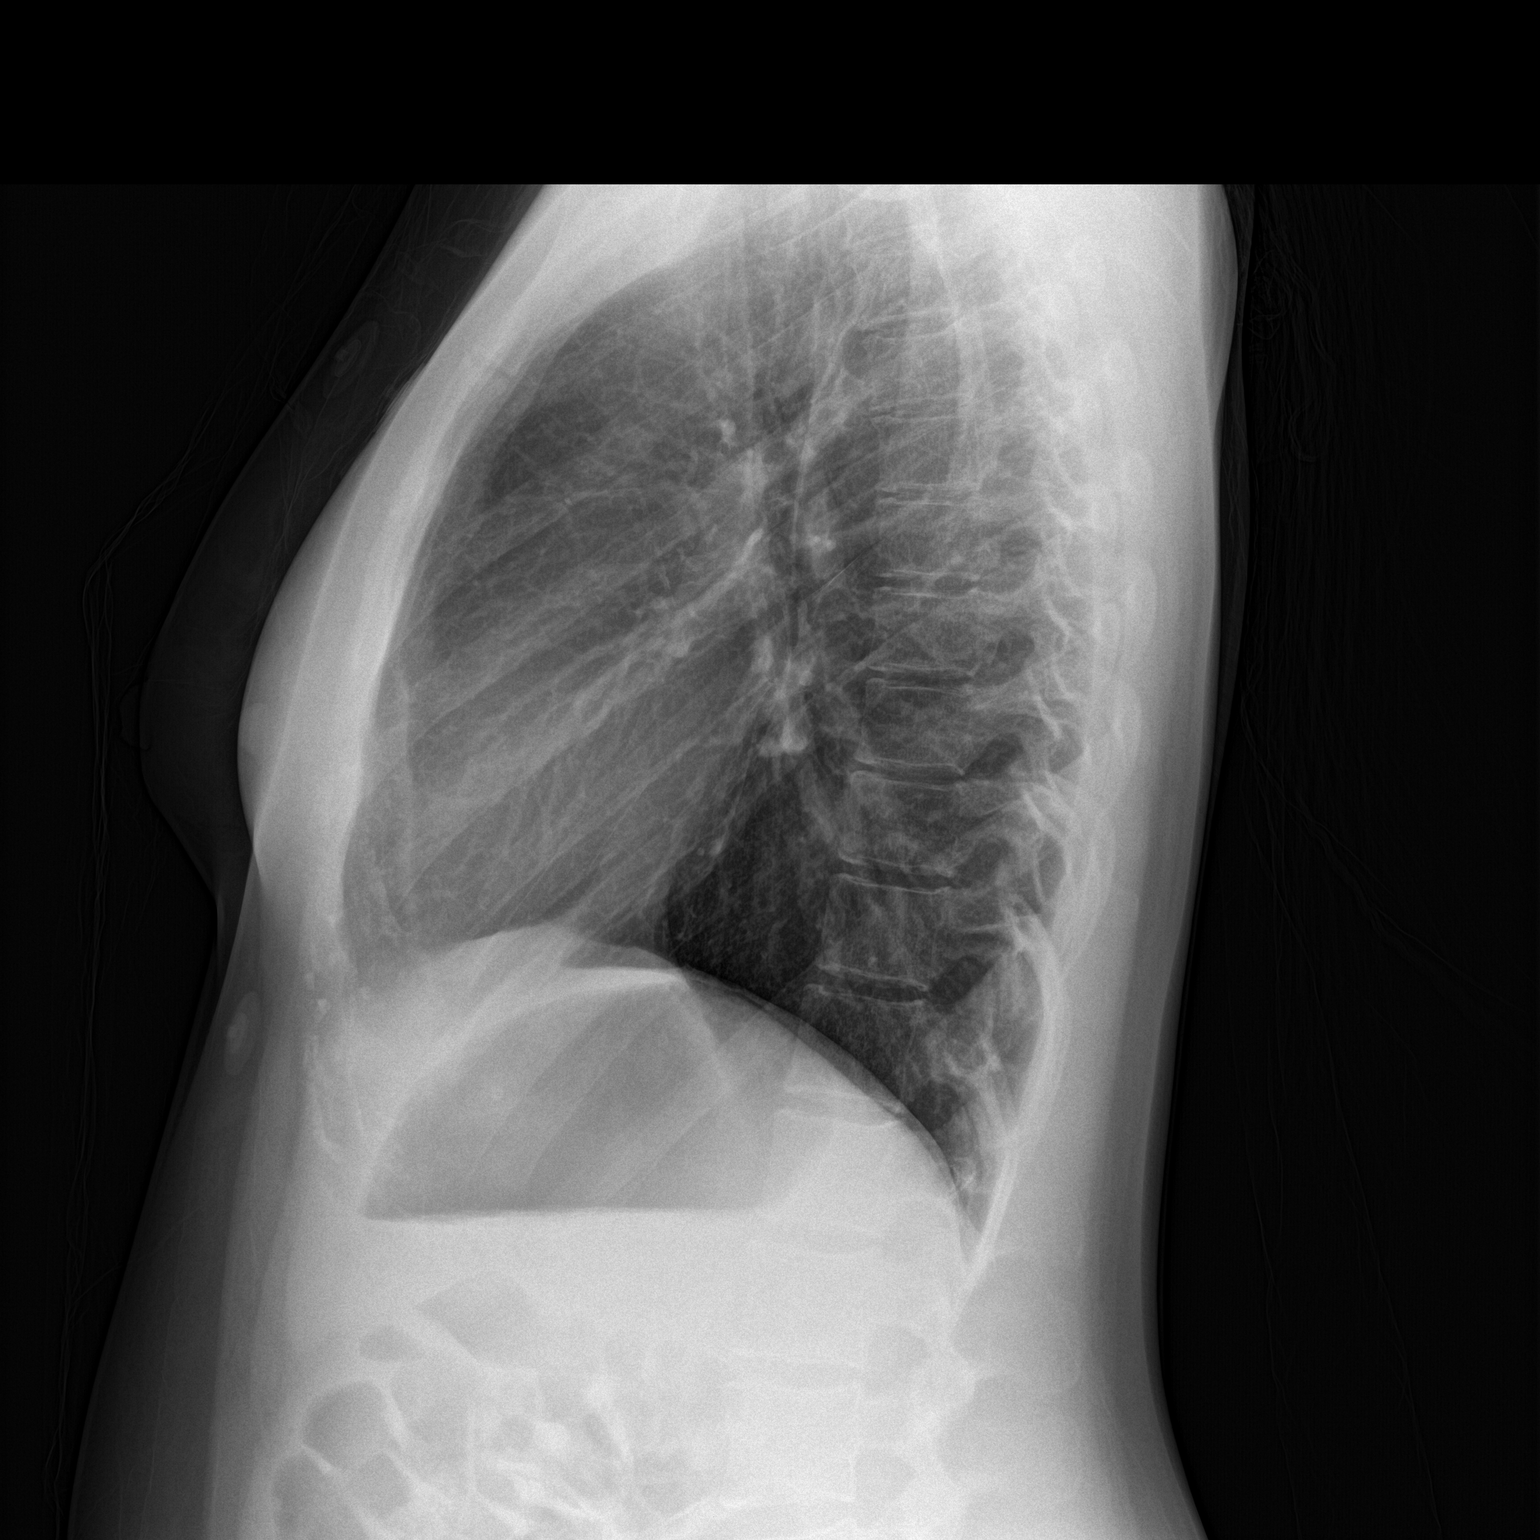

[2 of 2 positions shown; findings below may reference images not displayed]

FINDINGS: The cardiomediastinal contours are normal. The lungs are clear.
Pulmonary vasculature is normal. No consolidation, pleural effusion,
or pneumothorax. No acute osseous abnormalities are seen.
IMPRESSION: No acute pulmonary process.

## 2018-05-11 ENCOUNTER — Encounter (HOSPITAL_COMMUNITY): Payer: Self-pay | Admitting: Emergency Medicine

## 2018-05-11 ENCOUNTER — Emergency Department (HOSPITAL_COMMUNITY): Payer: 59

## 2018-05-11 ENCOUNTER — Other Ambulatory Visit: Payer: Self-pay

## 2018-05-11 ENCOUNTER — Emergency Department (HOSPITAL_COMMUNITY)
Admission: EM | Admit: 2018-05-11 | Discharge: 2018-05-11 | Disposition: A | Discharge status: Home / Self Care | Payer: 59 | Attending: Emergency Medicine | Admitting: Emergency Medicine

## 2018-05-11 DIAGNOSIS — O208 Other hemorrhage in early pregnancy: Secondary | ICD-10-CM | POA: Insufficient documentation

## 2018-05-11 DIAGNOSIS — Z3A01 Less than 8 weeks gestation of pregnancy: Secondary | ICD-10-CM | POA: Insufficient documentation

## 2018-05-11 DIAGNOSIS — O26891 Other specified pregnancy related conditions, first trimester: Secondary | ICD-10-CM | POA: Diagnosis not present

## 2018-05-11 DIAGNOSIS — R102 Pelvic and perineal pain: Secondary | ICD-10-CM | POA: Diagnosis not present

## 2018-05-11 DIAGNOSIS — O209 Hemorrhage in early pregnancy, unspecified: Secondary | ICD-10-CM

## 2018-05-11 LAB — CBC
HCT: 41.2 % (ref 36.0–46.0)
Hemoglobin: 12.7 g/dL (ref 12.0–15.0)
MCH: 26.3 pg (ref 26.0–34.0)
MCHC: 30.8 g/dL (ref 30.0–36.0)
MCV: 85.5 fL (ref 80.0–100.0)
Platelets: 279 10*3/uL (ref 150–400)
RBC: 4.82 MIL/uL (ref 3.87–5.11)
RDW: 16 % — AB (ref 11.5–15.5)
WBC: 7.9 10*3/uL (ref 4.0–10.5)
nRBC: 0 % (ref 0.0–0.2)

## 2018-05-11 LAB — ABO/RH: ABO/RH(D): O POS

## 2018-05-11 LAB — HCG, QUANTITATIVE, PREGNANCY: hCG, Beta Chain, Quant, S: 1166 m[IU]/mL — ABNORMAL HIGH (ref <5)

## 2018-05-11 NOTE — ED Provider Notes (Signed)
[facility] EMERGENCY DEPARTMENT Provider Note   CSN: [id number] Arrival date & time: 05/11/18  1554     History   Chief Complaint Chief Complaint  Patient presents with  . Threatened Miscarriage    HPI Cheyenne Deleon is a 27 y.o. female G2P0 who presents to the ED for evaluation of abdominal cramping that started today @ 1430. Patient reports feeling a gush of blood at 1545. Patient states she is [redacted] weeks pregnant. Patient reports a  Positive HPT last week followed by a positive test at her PCP office. Bleeding started suddenly today.   HPI  Past Medical History:  Diagnosis Date  . Anemia   . Depression   . Dizziness   . Dysthymic disorder   . Interpersonal problem   . Irregular menstrual cycle   . Oppositional defiant behavior   . Palpitations   . Polysubstance abuse (HCC)   . Tachycardia    exaggerated tacycardia response upon standing    There are no active problems to display for this patient.   Past Surgical History:  Procedure Laterality Date  . MULTIPLE TOOTH EXTRACTIONS  2009  . TONSILLECTOMY  2008     OB History    Gravida  1   Para      Term      Preterm      AB      Living        SAB      TAB      Ectopic      Multiple      Live Births               Home Medications    Prior to Admission medications   Medication Sig Start Date End Date Taking? Authorizing Provider  methocarbamol (ROBAXIN) 500 MG tablet Take 1 tablet (500 mg total) by mouth 2 (two) times daily. 02/01/14   Derwood Kaplan, MD  nitrofurantoin, macrocrystal-monohydrate, (MACROBID) 100 MG capsule Take 1 capsule (100 mg total) by mouth 2 (two) times daily. 02/01/14   Derwood Kaplan, MD  phenazopyridine (PYRIDIUM) 200 MG tablet Take 1 tablet (200 mg total) by mouth 3 (three) times daily. 02/01/14   Derwood Kaplan, MD  ranitidine (ZANTAC) 150 MG capsule Take 1 capsule (150 mg total) by mouth daily. 08/22/14   Tilden Fossa, MD  traMADol  (ULTRAM) 50 MG tablet Take 1 tablet (50 mg total) by mouth every 6 (six) hours as needed. 02/01/14   Derwood Kaplan, MD    Family History Family History  Problem Relation Age of Onset  . Cancer Paternal Grandmother     Social History Social History   Tobacco Use  . Smoking status: Current Every Day Smoker    Packs/day: 1.00    Types: Cigarettes  Substance Use Topics  . Alcohol use: Yes    Comment: Occasionally  . Drug use: Yes    Types: Marijuana, Methamphetamines     Allergies   Patient has no known allergies.   Review of Systems Review of Systems  Gastrointestinal: Positive for abdominal pain. Negative for nausea and vomiting.  Genitourinary: Positive for pelvic pain and vaginal bleeding.  All other systems reviewed and are negative.    Physical Exam Updated Vital Signs BP 108/70 (BP Location: Right Arm)   Pulse 86   Temp 98.2 F (36.8 C) (Oral)   Resp 16   Ht 5\' 6"  (1.676 m)   Wt 74.8 kg   SpO2 99%   BMI 26.63  kg/m   Physical Exam  Constitutional: She appears well-developed and well-nourished. No distress.  HENT:  Head: Normocephalic.  Eyes: Conjunctivae are normal.  Neck: Neck supple.  Cardiovascular: Normal rate.  Pulmonary/Chest: Effort normal.  Abdominal: Soft. There is tenderness.  Tender lower abdomen with deep palpation  Genitourinary:  Genitourinary Comments: External genitalia without lesions, moderate blood vaginal vault. Cervix closed, mild CMT, bilateral adnexal tenderness. Uterus without palpable enlargement.   Musculoskeletal: Normal range of motion.  Neurological: She is alert.  Skin: Skin is warm and dry.  Psychiatric: She has a normal mood and affect. Her behavior is normal.  Nursing note and vitals reviewed.    ED Treatments / Results  Labs (all labs ordered are listed, but only abnormal results are displayed) Labs Reviewed  CBC - Abnormal; Notable for the following components:      Result Value   RDW 16.0 (*)    All  other components within normal limits  HCG, QUANTITATIVE, PREGNANCY - Abnormal; Notable for the following components:   hCG, Beta Chain, Quant, S 1,166 (*)    All other components within normal limits  ABO/RH    Radiology [place] Ob Less Than 14 Weeks With Ob Transvaginal  Result Date: 05/11/2018 CLINICAL DATA:  Vaginal bleeding in early pregnancy.  Unsure of LMP. EXAM: OBSTETRIC <14 WK US AND TRANSVAGINAL OB US TECHNIQUE: Both transabdominal and transvaginal ultrasound examinations were performed for complete evaluation of the gestation as well as the maternal uterus, adnexal regions, and pelvic cul-de-sac. Transvaginal technique was performed to assess early pregnancy. COMPARISON:  None. FINDINGS: Intrauterine gestational sac: None Maternal uterus/adnexae: Endometrial thickness measures 11 mm. No fibroids identified. Both ovaries are normal in appearance. No adnexal mass or abnormal free fluid identified. IMPRESSION: Pregnancy of unknown anatomic location (no intrauterine gestational sac or adnexal mass identified). Differential diagnosis includes recent spontaneous abortion, IUP too early to visualize, and non-visualized ectopic pregnancy. Recommend correlation with serial beta-hCG levels, and follow up US if warranted clinically. Electronically Signed   By: Myles RosenthalJohn  Stahl M.D.   On: 05/11/2018 21:03    Procedures Procedures (including critical care time)  Medications Ordered in ED Medications - No data to display   Initial Impression / Assessment and Plan / ED Course  I have reviewed the triage vital signs and the nursing notes. 27 y.o. female here with bleeding and cramping in early pregnancy stable for d/c with bleeding minimal and no cramping. I spoke with the CNM in [facility] and they will see the patient for f/u on 05/14/18 and repeat the Bhcg. I discussed with the patient lab and U/S results and need for follow up. Patient agrees with plan. If symptoms worsen she will go to [facility] sooner.  Diff Dx  include SAB which is likely given the presentation of the patient vs ectopic pregnancy or IUP that is to early to visualize.  Final Clinical Impressions(s) / ED Diagnoses   Final diagnoses:  Bleeding in early pregnancy    ED Discharge Orders         Ordered    hCG, quantitative, pregnancy     05/11/18 2118           Kerrie Buffaloeese, Boluwatife Mutchler GreensburgM, NP 05/11/18 [phone]    Doug SouJacubowitz, Sam, MD 05/12/18 2056

## 2018-05-11 NOTE — Discharge Instructions (Signed)
[facility] for follow up in 3 days. Go there sooner for worsening symptoms.

## 2018-05-11 NOTE — ED Notes (Signed)
Patient transported to Ultrasound 

## 2018-05-11 NOTE — ED Triage Notes (Signed)
Pt to ED for evaluation of abdominal cramping since 1430 this afternoon. Pt stood up around 1545 and felt a gush of blood that was brownish red. Pt is four weeks pregnant. Hx of miscarriage.

## 2018-05-13 ENCOUNTER — Ambulatory Visit (INDEPENDENT_AMBULATORY_CARE_PROVIDER_SITE_OTHER): Payer: 59 | Admitting: *Deleted

## 2018-05-13 DIAGNOSIS — O3680X0 Pregnancy with inconclusive fetal viability, not applicable or unspecified: Secondary | ICD-10-CM

## 2018-05-13 LAB — HCG, QUANTITATIVE, PREGNANCY: hCG, Beta Chain, Quant, S: 326 m[IU]/mL — ABNORMAL HIGH (ref <5)

## 2018-05-13 NOTE — Progress Notes (Signed)
Here for stat bhcg. Denies pain. C/o light spotting . Explained we will draw stat bhcg and have her wait in lobby for results which we will then discuss with provider and then her. She voices understanding.  Reviewed results with Dr. Gabriel Rungatherine Wallace9 discussed not complete 48 hours since last bhcg) and informed patient that her bhcg dropped significantly and appears to be miscarriage. Support given, patient teary. Informed her we recommend stat bhcg in 48 hours in [facility]U.  Also ectopic precautions as we cannot completely rule out that. She voices understanding Bonita QuinLinda, RCharity fundraiser

## 2018-05-15 NOTE — Progress Notes (Signed)
Chart reviewed for nurse visit. Agree with plan of care.   Arvilla MarketWallace, Catherine Lauren, DO 05/15/2018 8:50 AM

## 2019-09-18 IMAGING — US US OB < 14 WEEKS - US OB TV
1 series · 14 of 28 positions shown · non-contrast
Comparison: None.

CLINICAL DATA: Vaginal bleeding in early pregnancy.  Unsure of LMP.

EXAM:
OBSTETRIC <14 WK US AND TRANSVAGINAL OB US
TECHNIQUE: Both transabdominal and transvaginal ultrasound examinations were
performed for complete evaluation of the gestation as well as the
maternal uterus, adnexal regions, and pelvic cul-de-sac.
Transvaginal technique was performed to assess early pregnancy.

[Series 1: us ob < 14 weeks - us ob tv · 0.28mm/px · 14 of 68 slices shown]
[im 3/68]
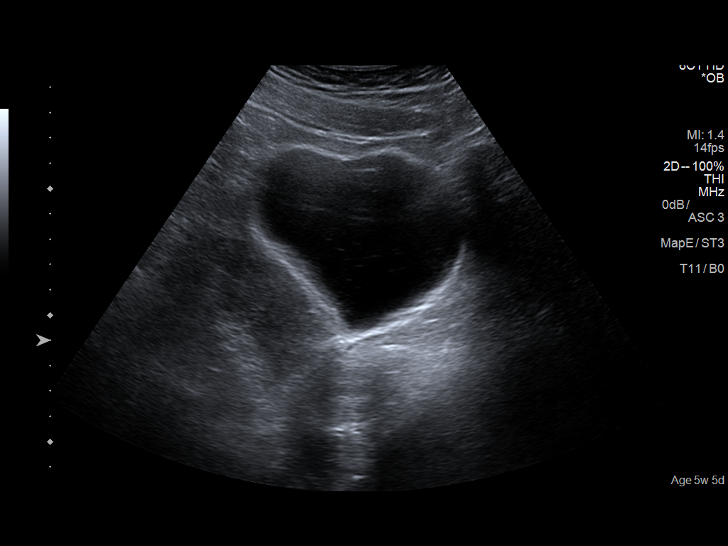
[im 8/68]
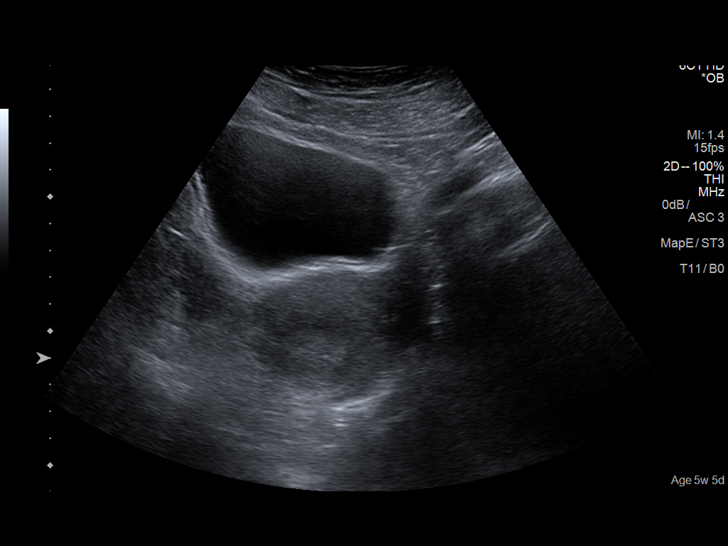
[im 13/68]
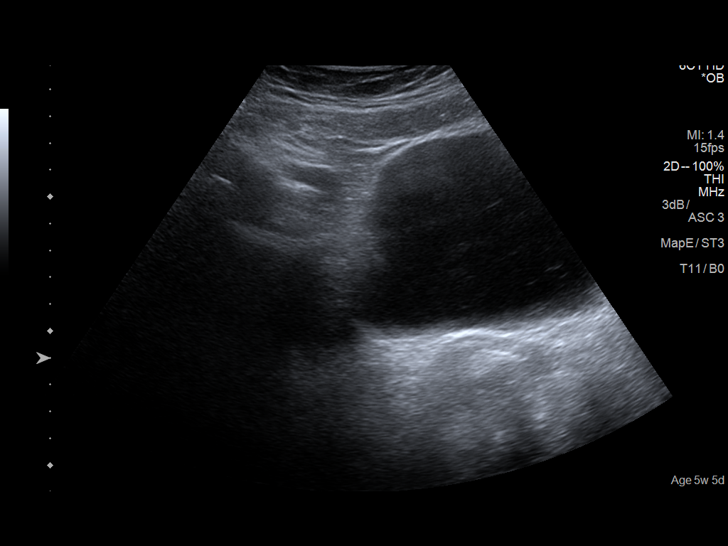
[im 18/68]
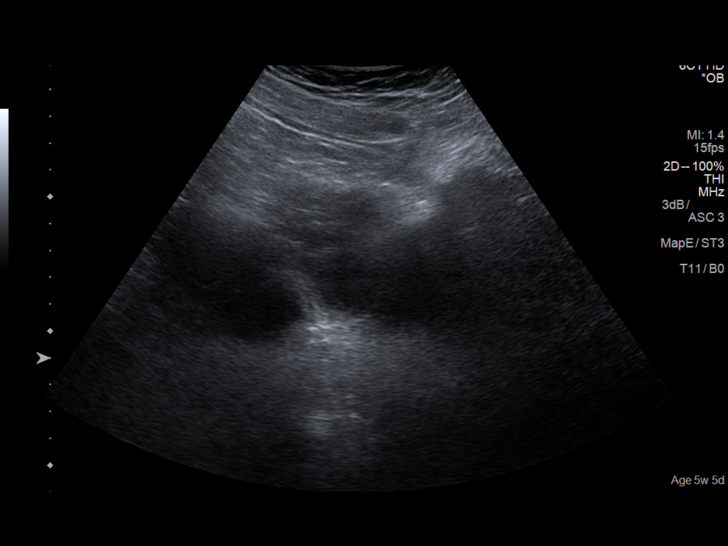
[im 23/68]
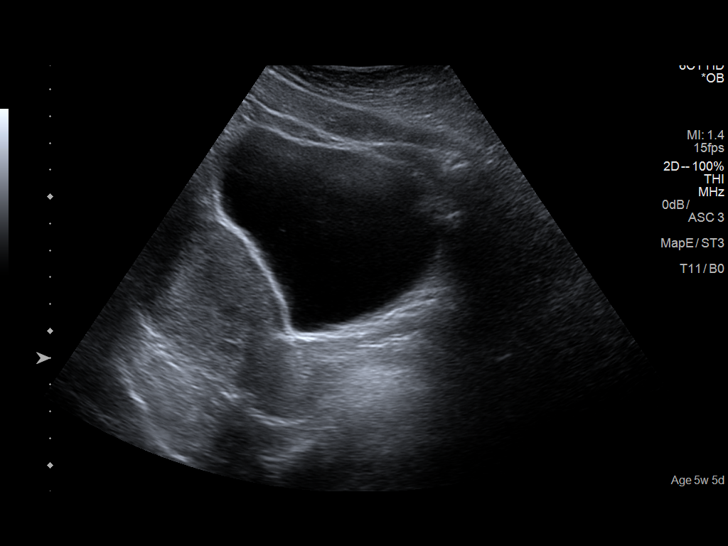
[im 28/68]
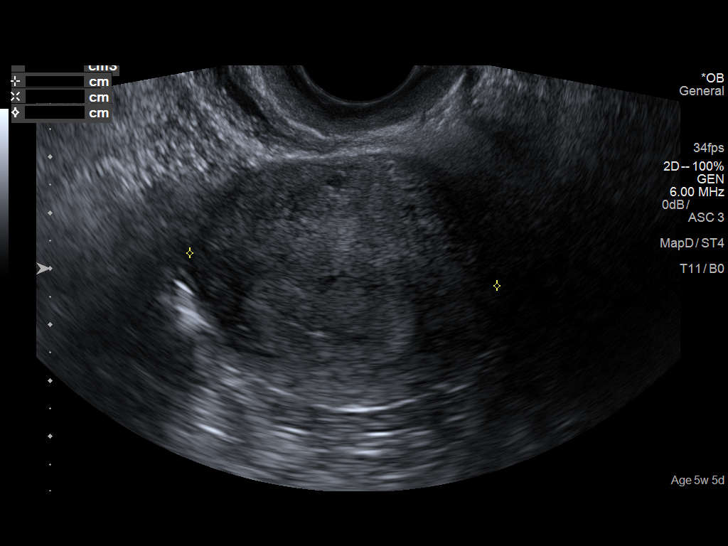
[im 33/68]
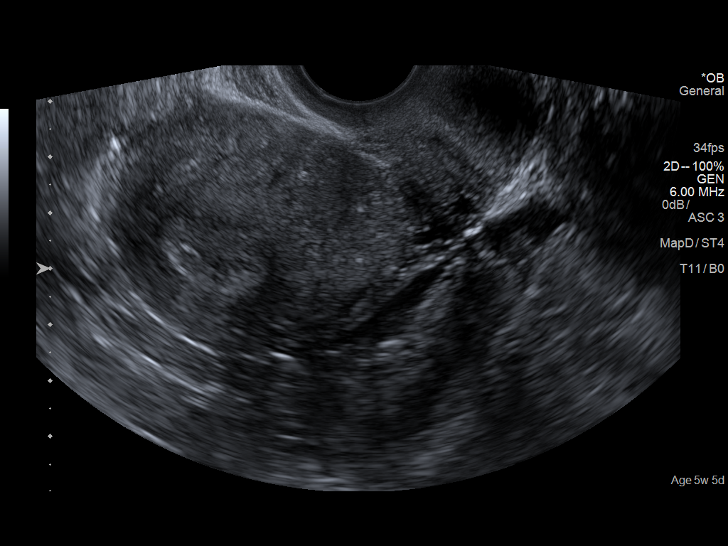
[im 38/68]
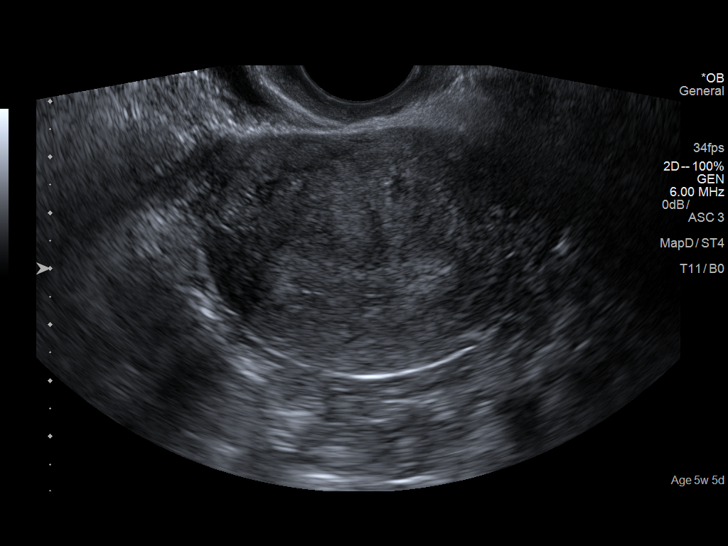
[im 43/68]
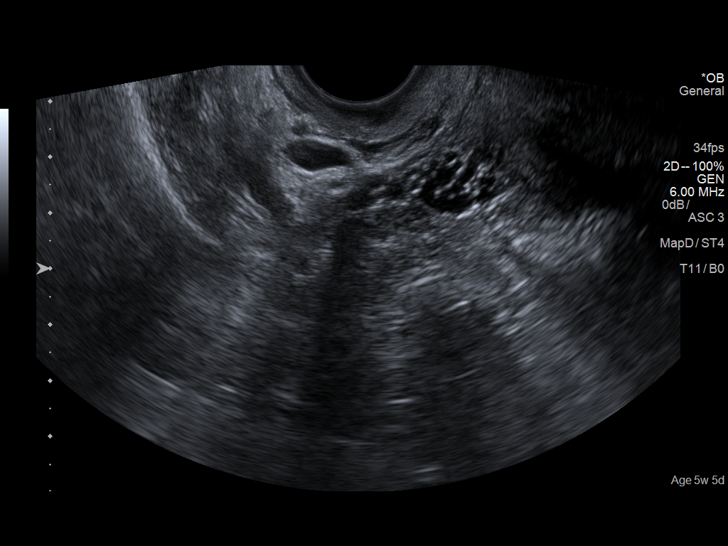
[im 48/68]
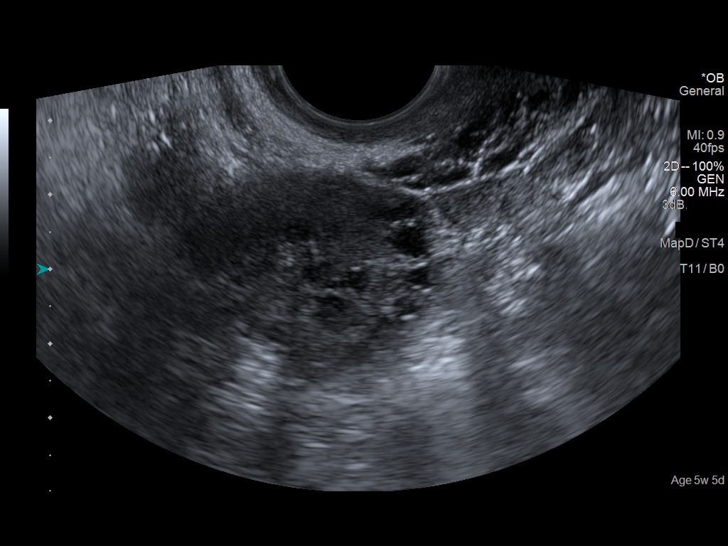
[im 53/68]
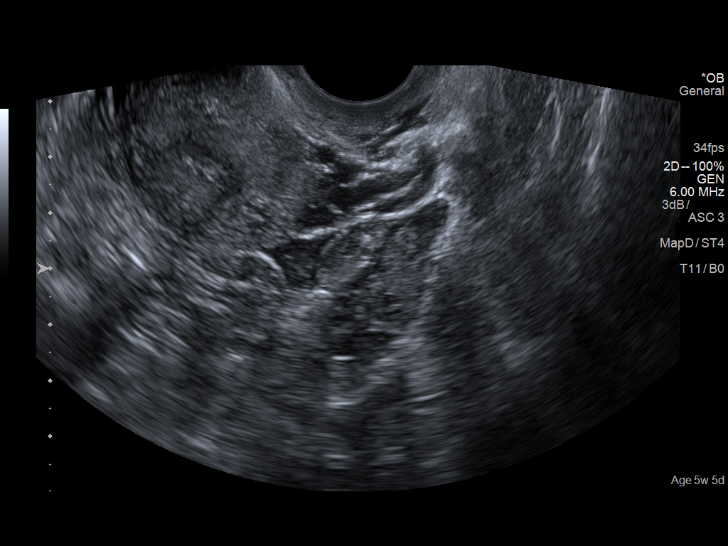
[im 58/68]
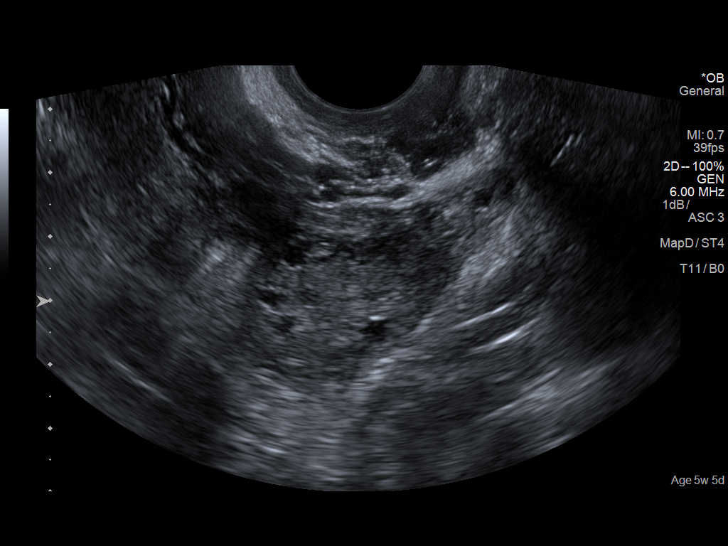
[im 63/68]
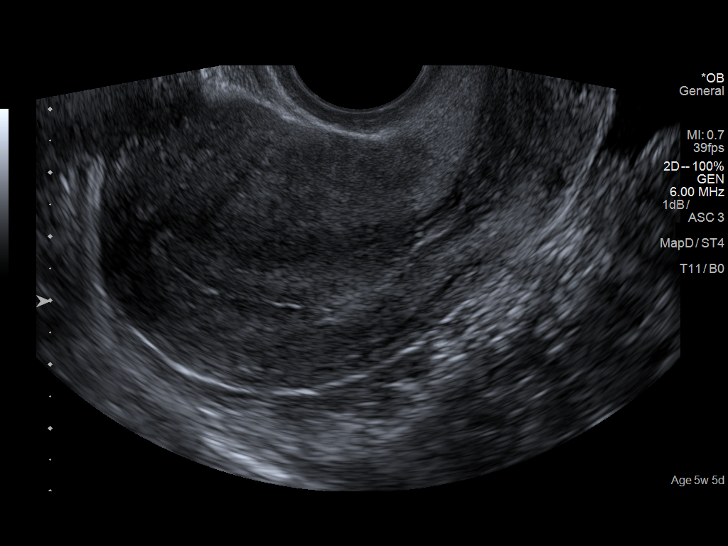
[im 68/68]
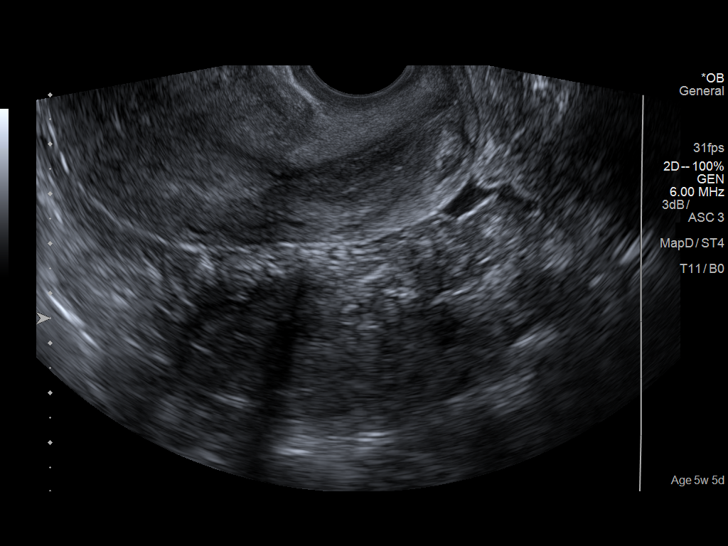

[14 of 28 positions shown; findings below may reference images not displayed]

FINDINGS: Intrauterine gestational sac: None

Maternal uterus/adnexae: Endometrial thickness measures 11 mm. No
fibroids identified. Both ovaries are normal in appearance. No
adnexal mass or abnormal free fluid identified.
IMPRESSION: Pregnancy of unknown anatomic location (no intrauterine gestational
sac or adnexal mass identified). Differential diagnosis includes
recent spontaneous abortion, IUP too early to visualize, and
non-visualized ectopic pregnancy. Recommend correlation with serial
beta-hCG levels, and follow up US if warranted clinically.
